# Patient Record
Sex: Female | Born: 1990 | Race: White | Hispanic: No | Smoking: Current every day smoker
Health system: Southern US, Community
[De-identification: ages and names within clinical notes are randomized; demographics above are authoritative.]

## PROBLEM LIST (undated history)

## (undated) HISTORY — PX: HERNIA REPAIR: SHX51

---

## 2019-07-28 ENCOUNTER — Encounter: Payer: Self-pay | Admitting: Intensive Care

## 2019-07-28 ENCOUNTER — Emergency Department
Admission: EM | Admit: 2019-07-28 | Discharge: 2019-07-28 | Disposition: A | Discharge status: Home / Self Care | Payer: Worker's Compensation | Attending: Emergency Medicine | Admitting: Emergency Medicine

## 2019-07-28 ENCOUNTER — Emergency Department: Payer: Worker's Compensation

## 2019-07-28 ENCOUNTER — Other Ambulatory Visit: Payer: Self-pay

## 2019-07-28 DIAGNOSIS — Y990 Civilian activity done for income or pay: Secondary | ICD-10-CM | POA: Diagnosis not present

## 2019-07-28 DIAGNOSIS — Y9259 Other trade areas as the place of occurrence of the external cause: Secondary | ICD-10-CM | POA: Diagnosis not present

## 2019-07-28 DIAGNOSIS — W230XXA Caught, crushed, jammed, or pinched between moving objects, initial encounter: Secondary | ICD-10-CM | POA: Diagnosis not present

## 2019-07-28 DIAGNOSIS — S60221A Contusion of right hand, initial encounter: Secondary | ICD-10-CM | POA: Insufficient documentation

## 2019-07-28 DIAGNOSIS — Y939 Activity, unspecified: Secondary | ICD-10-CM | POA: Insufficient documentation

## 2019-07-28 DIAGNOSIS — F17210 Nicotine dependence, cigarettes, uncomplicated: Secondary | ICD-10-CM | POA: Diagnosis not present

## 2019-07-28 DIAGNOSIS — S6991XA Unspecified injury of right wrist, hand and finger(s), initial encounter: Secondary | ICD-10-CM | POA: Diagnosis present

## 2019-07-28 MED ORDER — MELOXICAM 15 MG PO TABS: 15.0000 mg | ORAL_TABLET | Freq: Every day | ORAL | 1 refills | Status: AC

## 2019-07-28 NOTE — ED Notes (Signed)
Drug screen in progress with kadijah, ed tech.

## 2019-07-28 NOTE — ED Triage Notes (Signed)
Patient reports injuring right hand at work today. Reports this is workmans comp

## 2019-07-28 NOTE — ED Provider Notes (Signed)
Emergency Department Provider Note  ____________________________________________  Time seen: Approximately 8:07 PM  I have reviewed the triage vital signs and the nursing notes.   HISTORY  Chief Complaint Hand Pain   Historian Patient     HPI Anne Clarke is a 29 y.o. female presents to the emergency department with acute right hand pain.  Patient pinched her hand between 2 pieces of heavy machinery.  Patient has ecchymosis along the ulnar aspect of the right hand.  No numbness or tingling.  No abrasions or lacerations.  No other alleviating measures have been attempted.    History reviewed. No pertinent past medical history.   Immunizations up to date:  Yes.     History reviewed. No pertinent past medical history.  There are no problems to display for this patient.   Past Surgical History:  Procedure Laterality Date  . HERNIA REPAIR      Prior to Admission medications   Medication Sig Start Date End Date Taking? Authorizing Provider  meloxicam (MOBIC) 15 MG tablet Take 1 tablet (15 mg total) by mouth daily for 7 days. 07/28/19 08/04/19  Orvil Feil, PA-C    Allergies Benadryl [diphenhydramine] and Sulfur  History reviewed. No pertinent family history.  Social History Social History   Tobacco Use  . Smoking status: Current Every Day Smoker    Packs/day: 0.50    Types: Cigarettes  . Smokeless tobacco: Never Used  Substance Use Topics  . Alcohol use: Yes    Alcohol/week: 6.0 standard drinks    Types: 6 Cans of beer per week  . Drug use: Not Currently     Review of Systems  Constitutional: No fever/chills Eyes:  No discharge ENT: No upper respiratory complaints. Respiratory: no cough. No SOB/ use of accessory muscles to breath Gastrointestinal:   No nausea, no vomiting.  No diarrhea.  No constipation. Musculoskeletal: Patient has right hand pain.  Skin: Negative for rash, abrasions, lacerations,  ecchymosis.    ____________________________________________   PHYSICAL EXAM:  VITAL SIGNS: ED Triage Vitals  Enc Vitals Group     BP 07/28/19 1839 118/79     Pulse Rate 07/28/19 1839 99     Resp 07/28/19 1839 16     Temp 07/28/19 1839 99.7 F (37.6 C)     Temp Source 07/28/19 1839 Oral     SpO2 07/28/19 1839 96 %     Weight 07/28/19 1840 117 lb (53.1 kg)     Height 07/28/19 1840 5' (1.524 m)     Head Circumference --      Peak Flow --      Pain Score 07/28/19 1840 7     Pain Loc --      Pain Edu? --      Excl. in GC? --      Constitutional: Alert and oriented. Well appearing and in no acute distress. Eyes: Conjunctivae are normal. PERRL. EOMI. Head: Atraumatic. Cardiovascular: Normal rate, regular rhythm. Normal S1 and S2.  Good peripheral circulation. Respiratory: Normal respiratory effort without tachypnea or retractions. Lungs CTAB. Good air entry to the bases with no decreased or absent breath sounds Gastrointestinal: Bowel sounds x 4 quadrants. Soft and nontender to palpation. No guarding or rigidity. No distention. Musculoskeletal: Full range of motion to all extremities. No obvious deformities noted.  Palpable radial pulse, right.  Capillary refill less than 2 seconds on the right. Neurologic:  Normal for age. No gross focal neurologic deficits are appreciated.  Skin:  Skin is warm, dry  and intact. No rash noted.  Patient has ecchymosis visualized along the ulnar aspect of the right hand. Psychiatric: Mood and affect are normal for age. Speech and behavior are normal.   ____________________________________________   LABS (all labs ordered are listed, but only abnormal results are displayed)  Labs Reviewed - No data to display ____________________________________________  EKG   ____________________________________________  RADIOLOGY Unk Pinto, personally viewed and evaluated these images (plain radiographs) as part of my medical decision making, as  well as reviewing the written report by the radiologist.  DG Hand Complete Right  Result Date: 07/28/2019 CLINICAL DATA:  Swelling pain EXAM: RIGHT HAND - COMPLETE 3+ VIEW COMPARISON:  None. FINDINGS: There is no evidence of fracture or dislocation. There is no evidence of arthropathy or other focal bone abnormality. Mild dorsal soft tissue swelling. IMPRESSION: Negative. Electronically Signed   By: Prudencio Pair M.D.   On: 07/28/2019 19:04    ____________________________________________    PROCEDURES  Procedure(s) performed:     Procedures     Medications - No data to display   ____________________________________________   INITIAL IMPRESSION / ASSESSMENT AND PLAN / ED COURSE  Pertinent labs & imaging results that were available during my care of the patient were reviewed by me and considered in my medical decision making (see chart for details).    Assessment and plan Hand contusion 29 year old female presents to the emergency department after pinching her hand between 2 pieces of equipment while at work.  Vital signs were reassuring at triage.  On physical exam, patient could move all 5 right fingers.  She could pronate and supinate at right elbow.  She demonstrated full range of motion at the right wrist.  No flexor or extensor tendon deficits were appreciated with testing.  X-ray of the right hand revealed no bony abnormality.  She was discharged with meloxicam for pain and inflammation.  Return precautions were given to return with new or worsening symptoms.  ____________________________________________  FINAL CLINICAL IMPRESSION(S) / ED DIAGNOSES  Final diagnoses:  Contusion of right hand, initial encounter      NEW MEDICATIONS STARTED DURING THIS VISIT:  ED Discharge Orders         Ordered    meloxicam (MOBIC) 15 MG tablet  Daily     Discontinue  Reprint     07/28/19 2003              This chart was dictated using voice recognition  software/Dragon. Despite best efforts to proofread, errors can occur which can change the meaning. Any change was purely unintentional.     Karren Cobble 07/28/19 2011    Delman Kitten, MD 07/29/19 [phone]

## 2019-07-28 NOTE — Discharge Instructions (Signed)
Take meloxicam for pain and inflammation. 

## 2021-08-25 IMAGING — CR DG HAND COMPLETE 3+V*R*
3 series · 3 of 3 positions shown · non-contrast
Comparison: None.

CLINICAL DATA: Swelling pain

EXAM:
RIGHT HAND - COMPLETE 3+ VIEW

[hand ap]
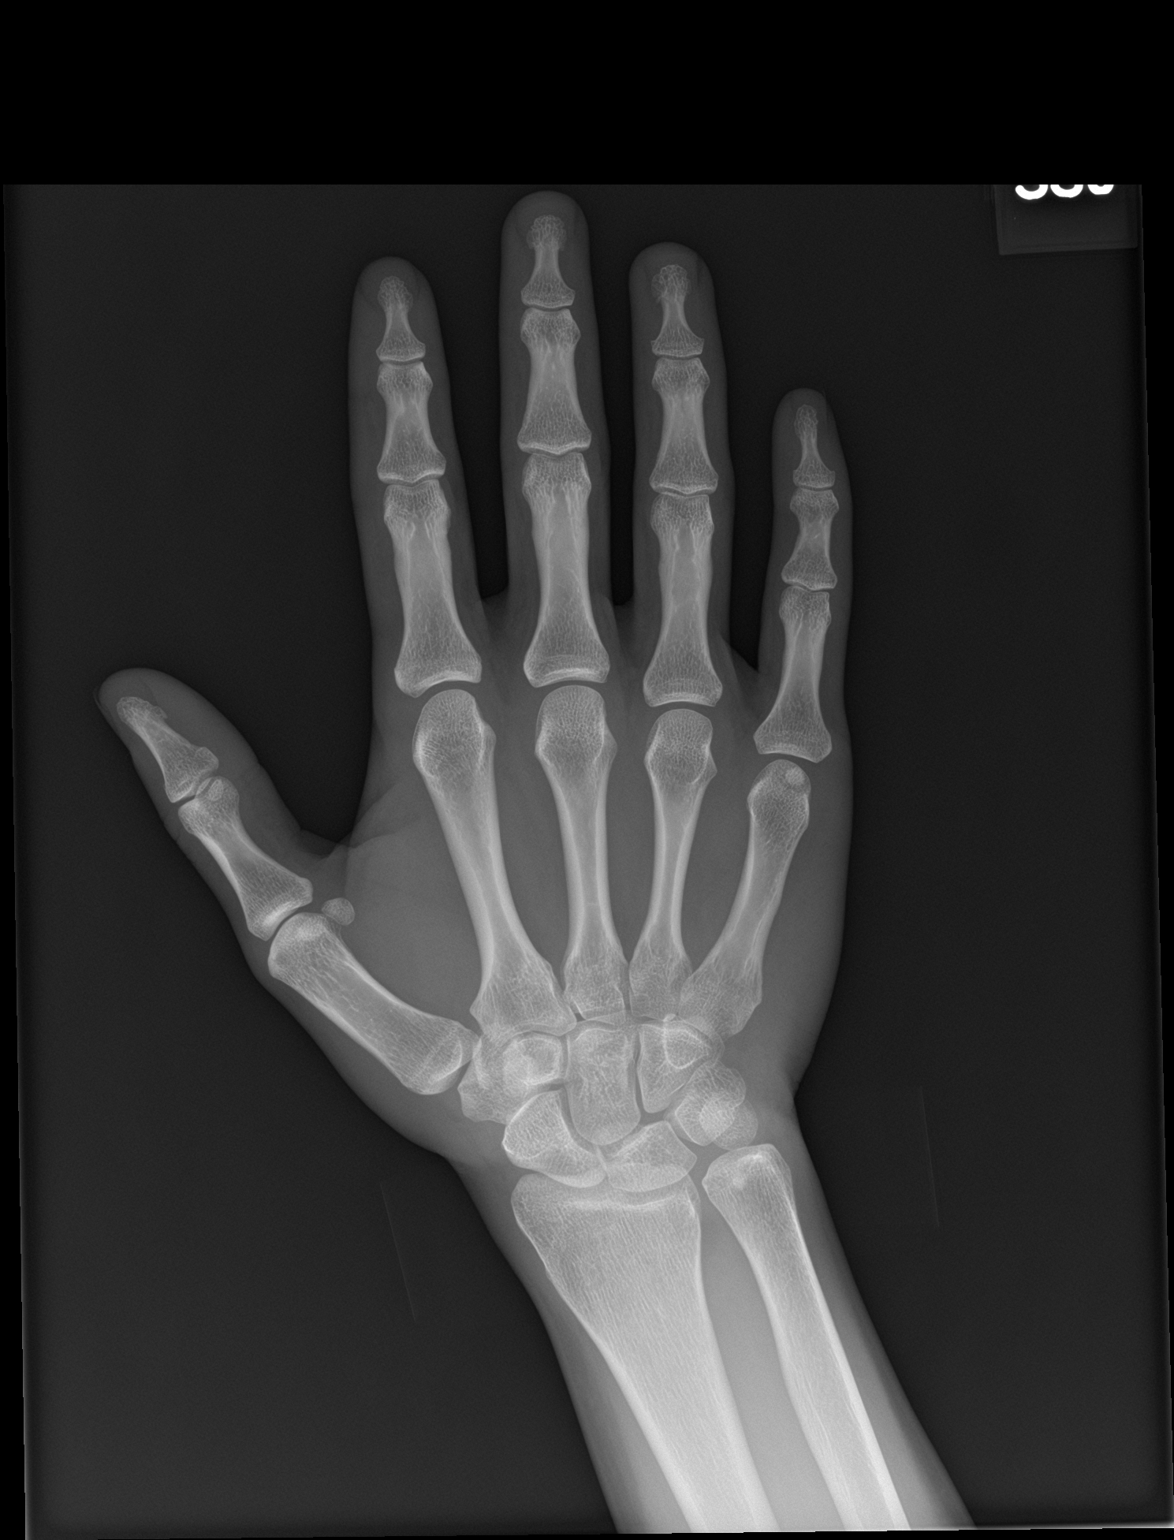

[hand obl]
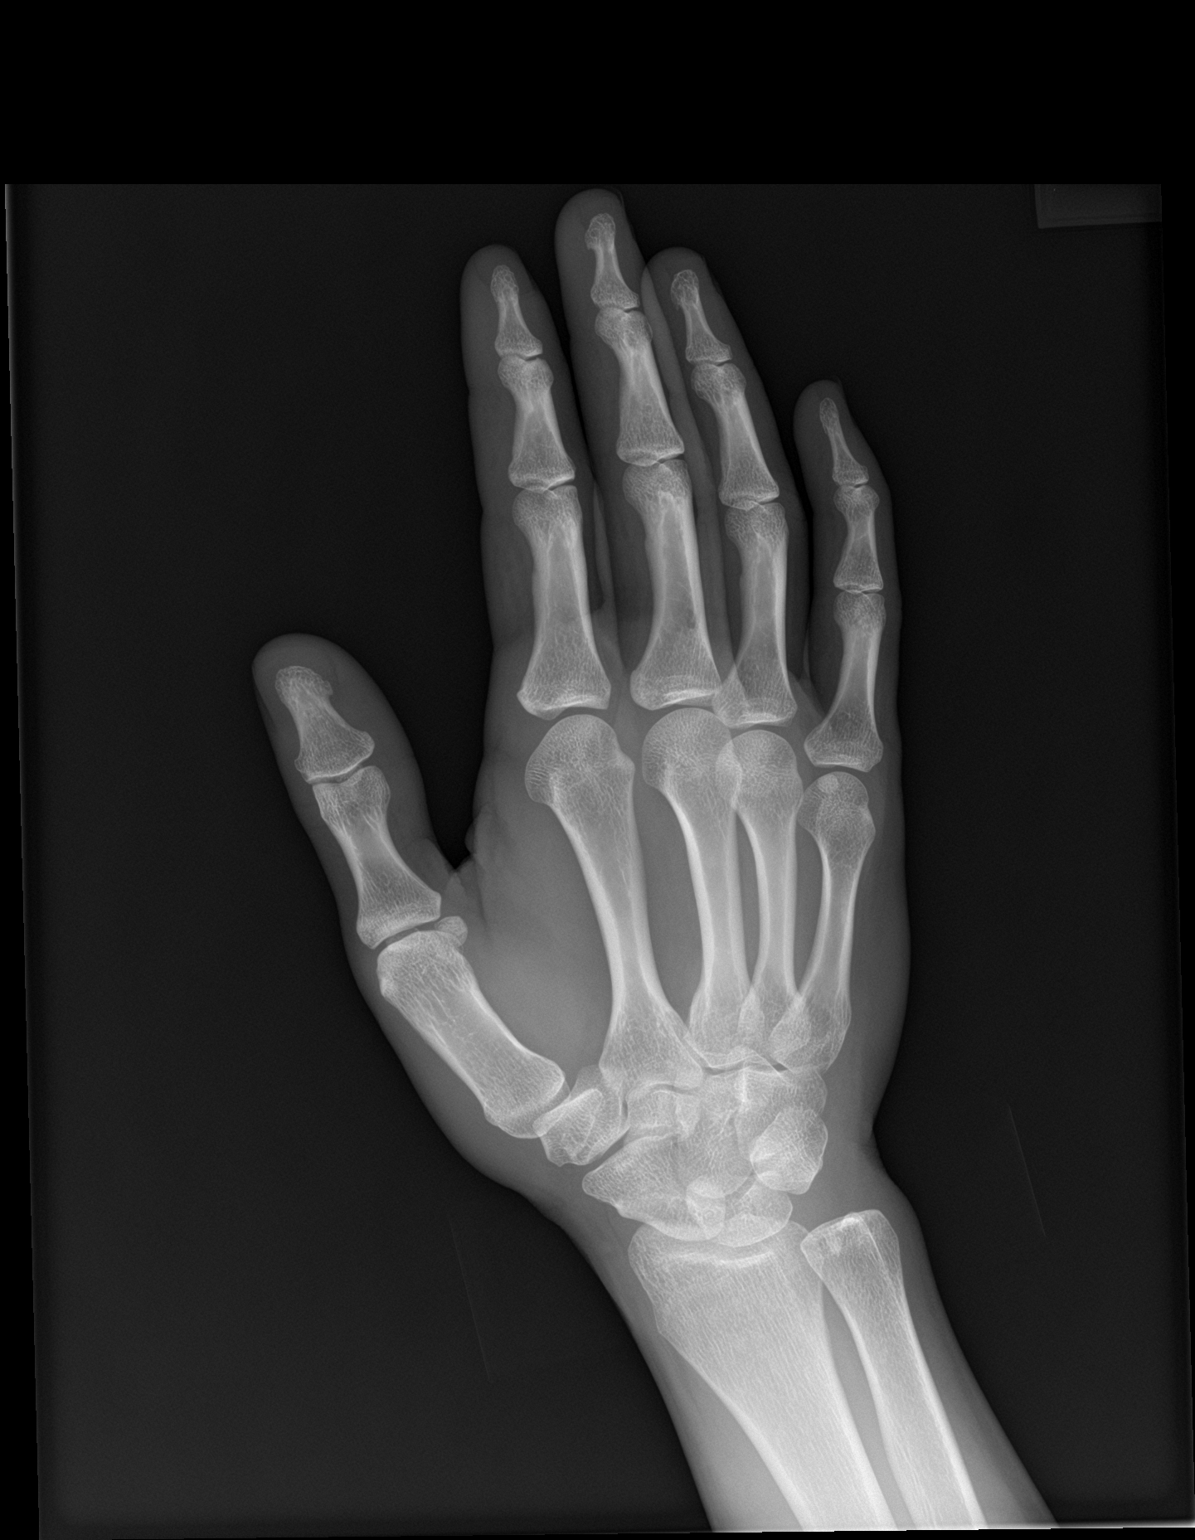

[hand lat]
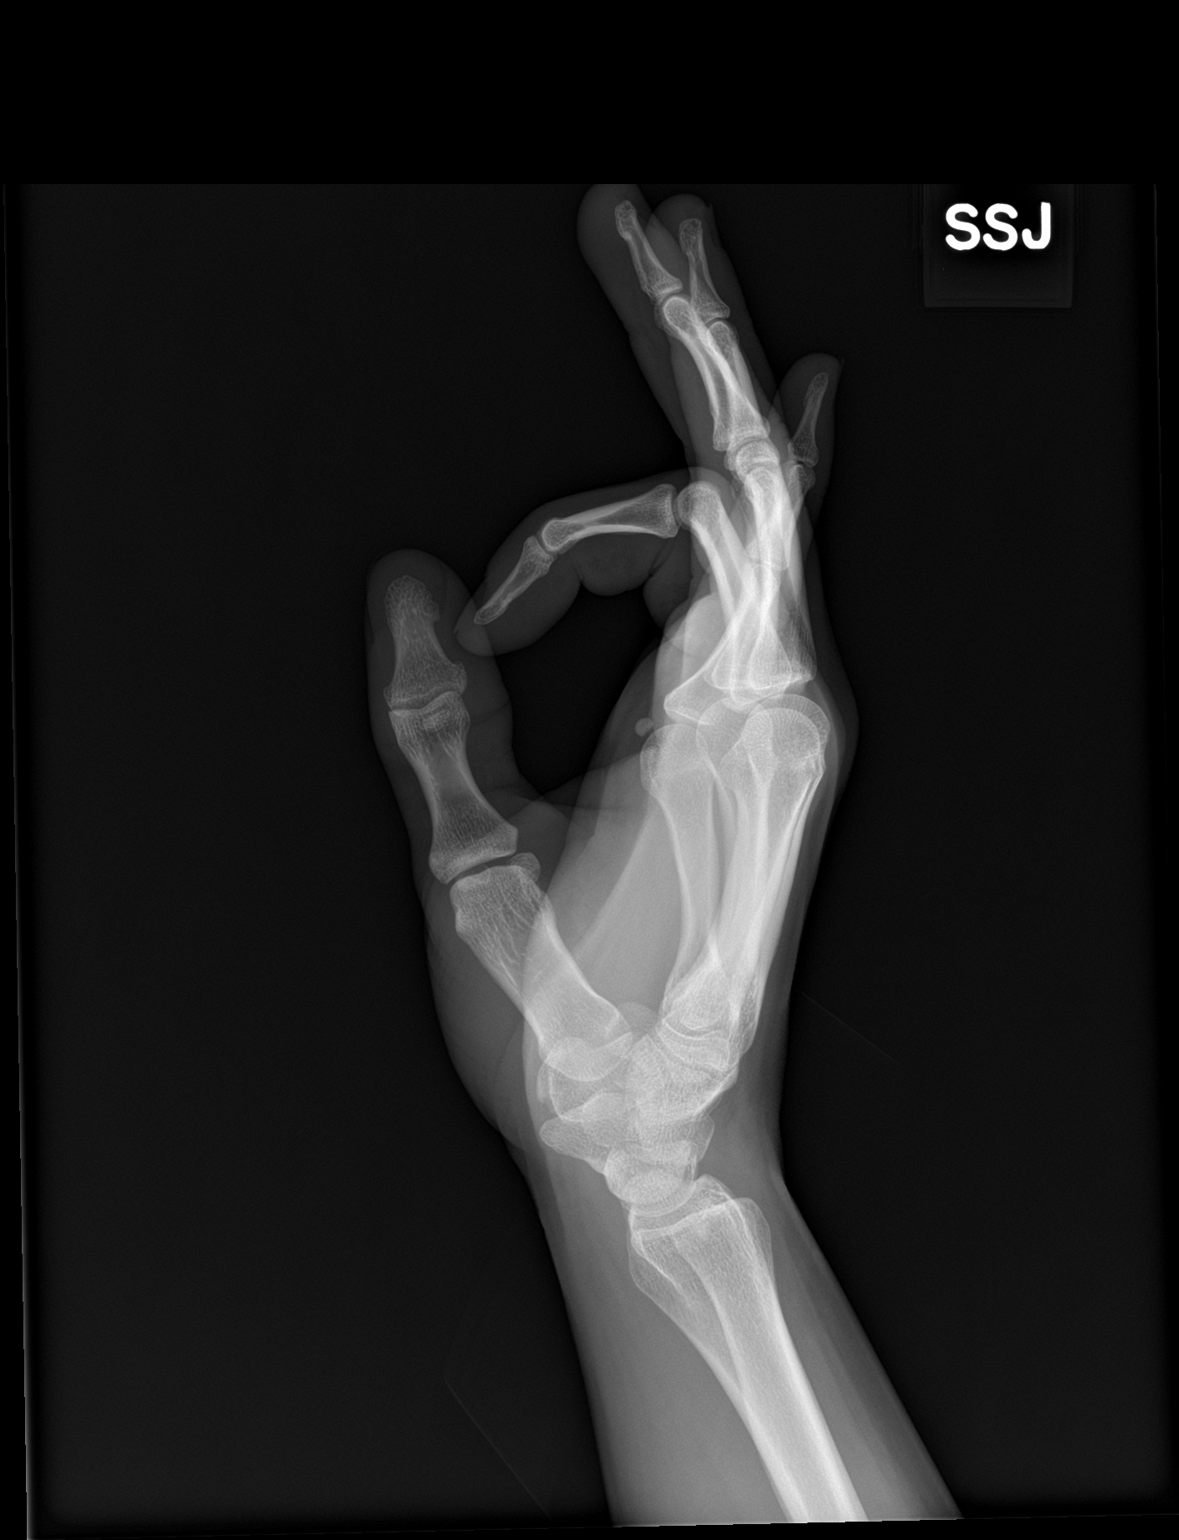

[3 of 3 positions shown; findings below may reference images not displayed]

FINDINGS: There is no evidence of fracture or dislocation. There is no
evidence of arthropathy or other focal bone abnormality. Mild dorsal
soft tissue swelling.
IMPRESSION: Negative.
